# Patient Record
Sex: Female | Born: 1995 | Race: Black or African American | Hispanic: No | Marital: Single | Smoking: Never smoker
Health system: Southern US, Community
[De-identification: ages and names within clinical notes are randomized; demographics above are authoritative.]

---
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
## 2018-02-02 ENCOUNTER — Encounter (HOSPITAL_COMMUNITY): Payer: Self-pay

## 2018-02-02 ENCOUNTER — Other Ambulatory Visit: Payer: Self-pay

## 2018-02-02 ENCOUNTER — Emergency Department (HOSPITAL_COMMUNITY)
Admission: EM | Admit: 2018-02-02 | Discharge: 2018-02-02 | Disposition: A | Discharge status: Home / Self Care | Payer: Medicaid Other | Attending: Emergency Medicine | Admitting: Emergency Medicine

## 2018-02-02 DIAGNOSIS — Z79899 Other long term (current) drug therapy: Secondary | ICD-10-CM | POA: Insufficient documentation

## 2018-02-02 DIAGNOSIS — R21 Rash and other nonspecific skin eruption: Secondary | ICD-10-CM

## 2018-02-02 MED ORDER — TRIAMCINOLONE ACETONIDE 0.1 % EX CREA: 1.0000 "application " | TOPICAL_CREAM | Freq: Two times a day (BID) | CUTANEOUS | 0 refills | Status: AC

## 2018-02-02 NOTE — Discharge Instructions (Signed)
I have prescribed you a steroid cream for the rash. You may apply this 2-3 times per day. Please be sure to clean all bed linens in your room.   Thank you for allowing [place] to take care of you today.

## 2018-02-02 NOTE — ED Triage Notes (Signed)
[place] from [place] [employer] and had seen a bug 3 days ago in apartment.  Apartment sent someone today to check the bugs out and told her to come to ED to see if bites on face and arms were beg bugs.  Picture of bugs on phone look suspicious for beg bugs.  Pt denies seeing any bugs on her clothes skin or in her car.  All bugs she saw in apartment were killed.  A&Ox4

## 2018-02-02 NOTE — ED Provider Notes (Signed)
[facility] EMERGENCY DEPARTMENT Provider Note  CSN: [id number] Arrival date & time: 02/02/18  2042  History   Chief Complaint Chief Complaint  Patient presents with  . Insect Bite   HPI Harleigh Civello is a 22 y.o. female with no significant medical history who presented to the ED for   Animal Bite  Contact animal:  Insect (Suspected bed bugs) Animal bite location: Reports bites on extremities, chest and back. Time since incident:  4 days Pain details:    Quality:  Itching   Severity:  No pain   Progression:  Unchanged Incident location: [employer]. Ineffective treatments:  None tried Associated symptoms: rash   Associated symptoms: no fever, no numbness and no swelling     History reviewed. No pertinent past medical history.  There are no active problems to display for this patient.   History reviewed. No pertinent surgical history.   OB History   None      Home Medications    Prior to Admission medications   Medication Sig Start Date End Date Taking? Authorizing Provider  ibuprofen (ADVIL,MOTRIN) 600 MG tablet Take 1 tablet (600 mg total) by mouth every 8 (eight) hours as needed. 03/31/16   Trixie Dredge, PA-C  triamcinolone cream (KENALOG) 0.1 % Apply 1 application topically 2 (two) times daily. 02/02/18   Mortis, Sharyon Medicus, PA-C    Family History History reviewed. No pertinent family history.  Social History Social History   Tobacco Use  . Smoking status: Never Smoker  . Smokeless tobacco: Never Used  Substance Use Topics  . Alcohol use: No  . Drug use: Not on file     Allergies   Patient has no known allergies.   Review of Systems Review of Systems  Constitutional: Negative for chills and fever.  Eyes: Negative.   Genitourinary: Negative.   Musculoskeletal: Negative.   Skin: Positive for rash.  Neurological: Negative for dizziness, weakness, light-headedness and numbness.   Physical Exam Updated Vital Signs BP  (!) 158/81   Pulse 86   Temp 98.2 F (36.8 C) (Oral)   Resp 16   SpO2 100%   Physical Exam  Constitutional: She appears well-developed and well-nourished. No distress.  Musculoskeletal: Normal range of motion.  Skin: Skin is warm and intact. Capillary refill takes less than 2 seconds. No lesion and no rash noted. No erythema.  No rash visualized.  Nursing note and vitals reviewed.  ED Treatments / Results  Labs (all labs ordered are listed, but only abnormal results are displayed) Labs Reviewed - No data to display  EKG None  Radiology No results found.  Procedures Procedures (including critical care time)  Medications Ordered in ED Medications - No data to display   Initial Impression / Assessment and Plan / ED Course  Triage vital signs and the nursing notes have been reviewed.  Pertinent labs & imaging results that were available during care of the patient were reviewed and considered in medical decision making (see chart for details).  Patient presents to the ED afebrile with complaints of a rash. She reports that her dorm has bed bugs since Monday. She describes generalized, pruritic rash all over her body from bed bug bites. However, on physical exam, there is no rash present or any other findings suggestive of bed bugs.    Final Clinical Impressions(s) / ED Diagnoses  1. Rash. Rx for Kenalog prescribed. Education provided on appropriate house and linen cleaning.  Dispo: Home. After thorough clinical evaluation, this  patient is determined to be medically stable and can be safely discharged with the previously mentioned treatment and/or outpatient follow-up/referral(s). At this time, there are no other apparent medical conditions that require further screening, evaluation or treatment.   Final diagnoses:  Rash    ED Discharge Orders         Ordered    triamcinolone cream (KENALOG) 0.1 %  2 times daily     02/02/18 2158            Reva Bores 02/02/18 2158    Melene Plan, DO 02/03/18 0002

## 2018-07-08 IMAGING — CR DG KNEE COMPLETE 4+V*L*
5 series · 5 of 5 positions shown · non-contrast
Comparison: None.

CLINICAL DATA: Slipped on wet floor, fell.  LEFT knee pain.

EXAM:
LEFT KNEE - COMPLETE 4+ VIEW

[t knee ap left]
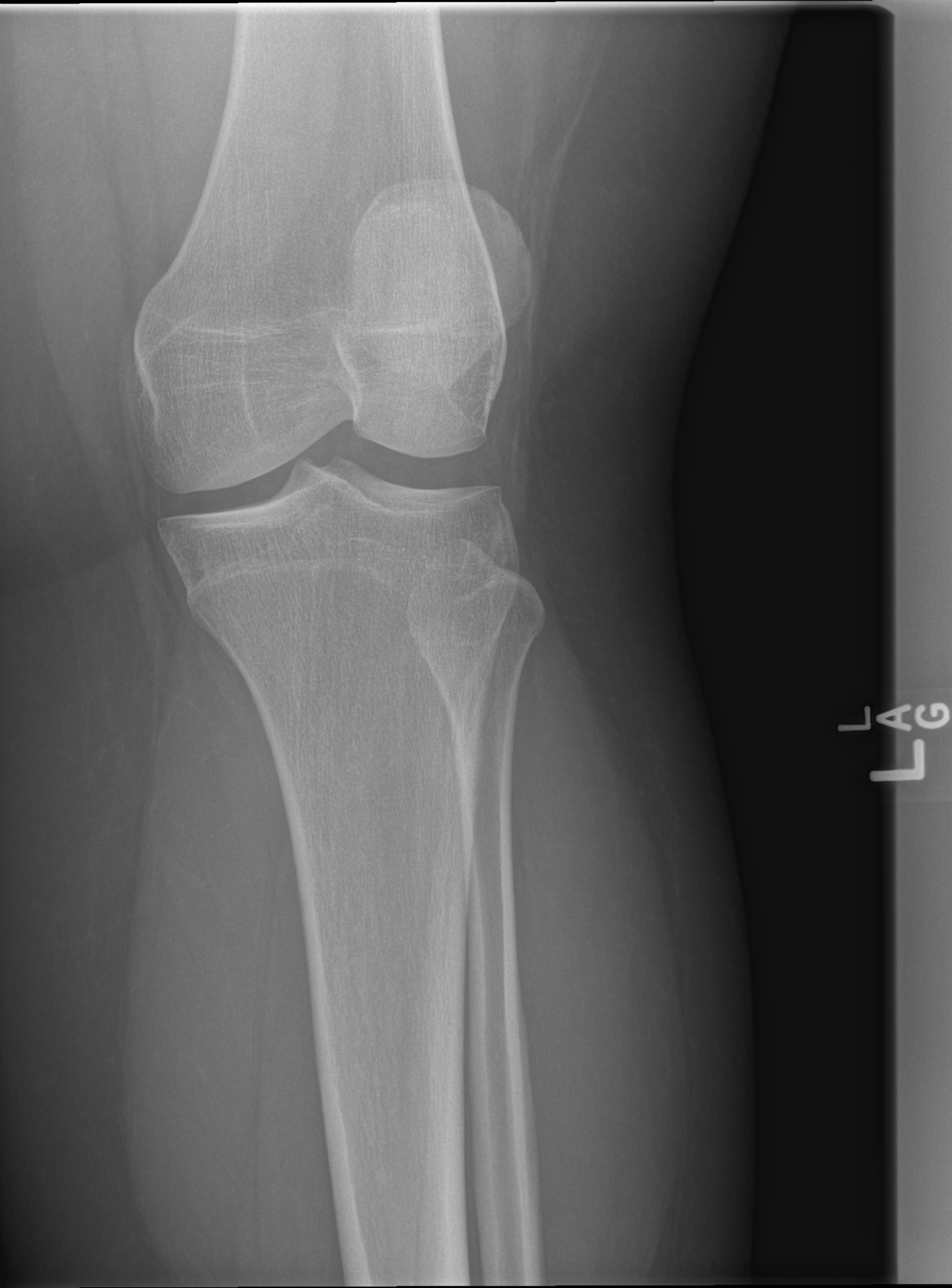

[t knee obl left (1 of 2)]
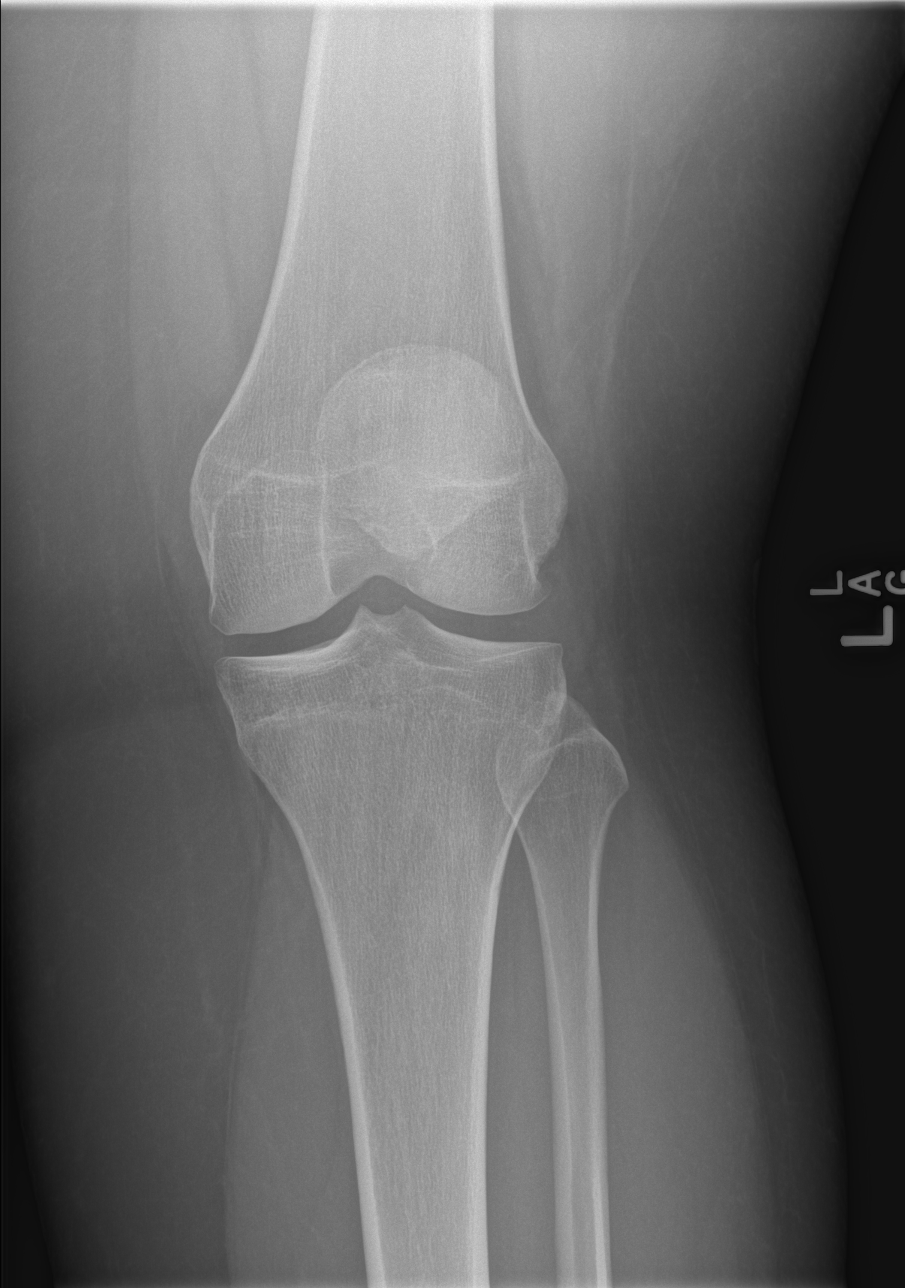

[t knee obl left (2 of 2)]
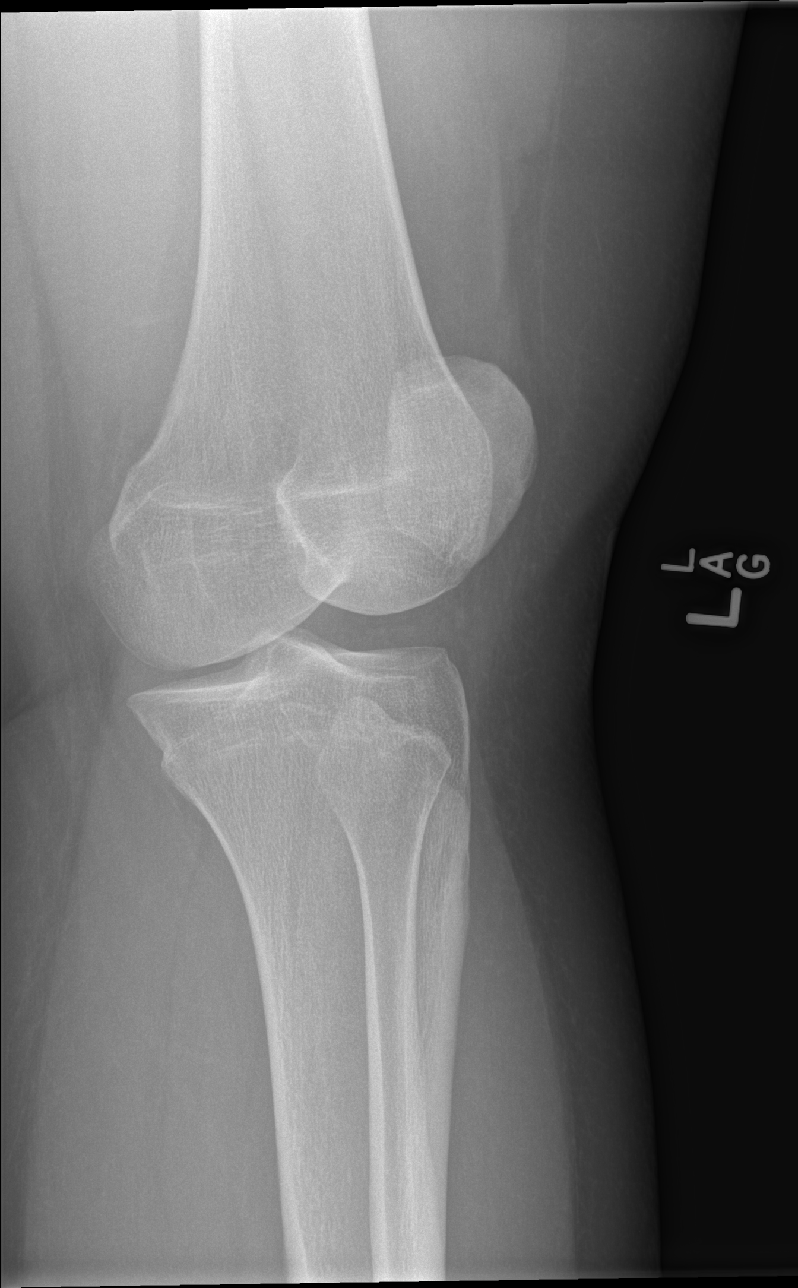

[t knee lat left (1 of 2)]
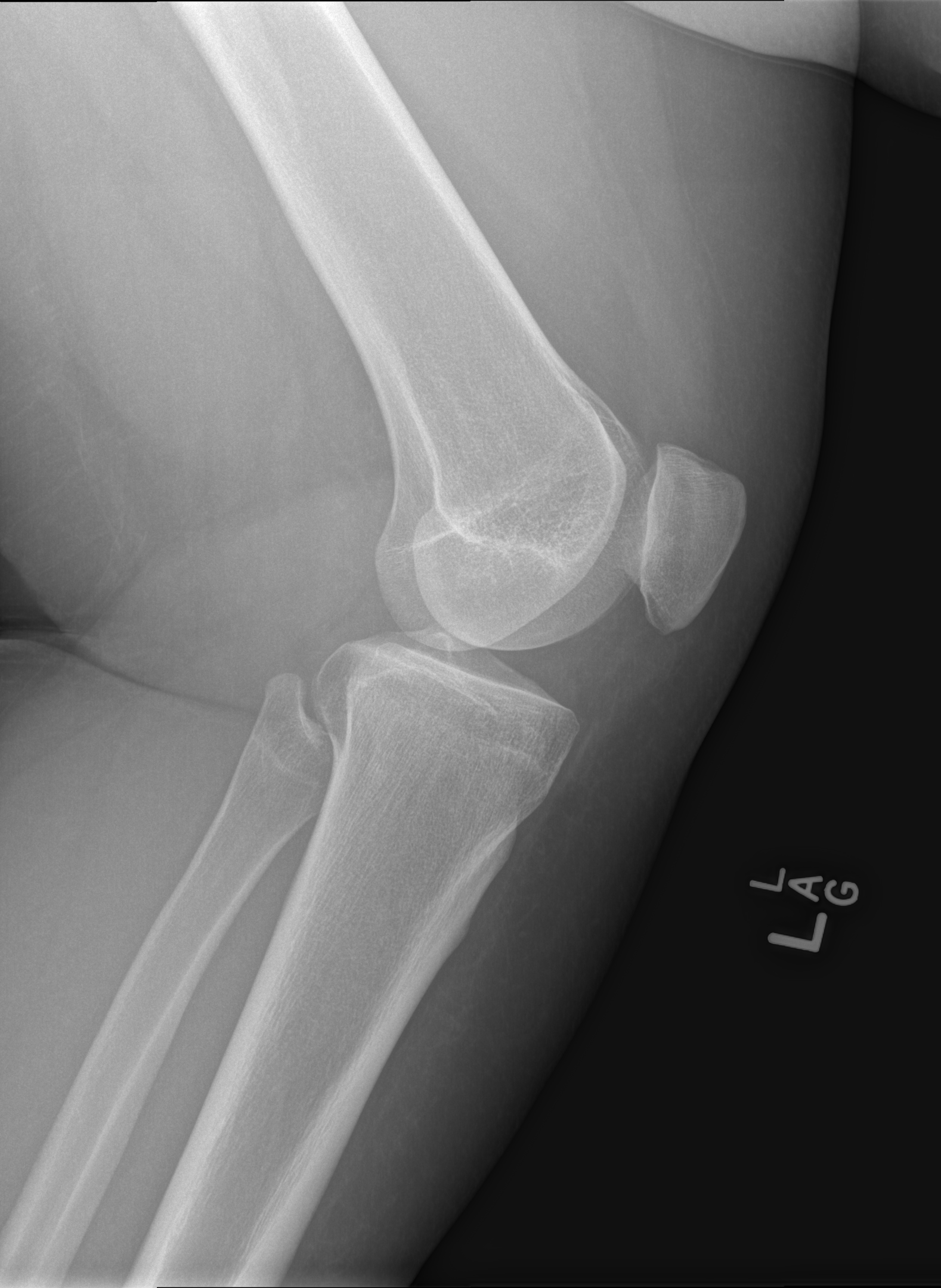

[t knee lat left (2 of 2)]
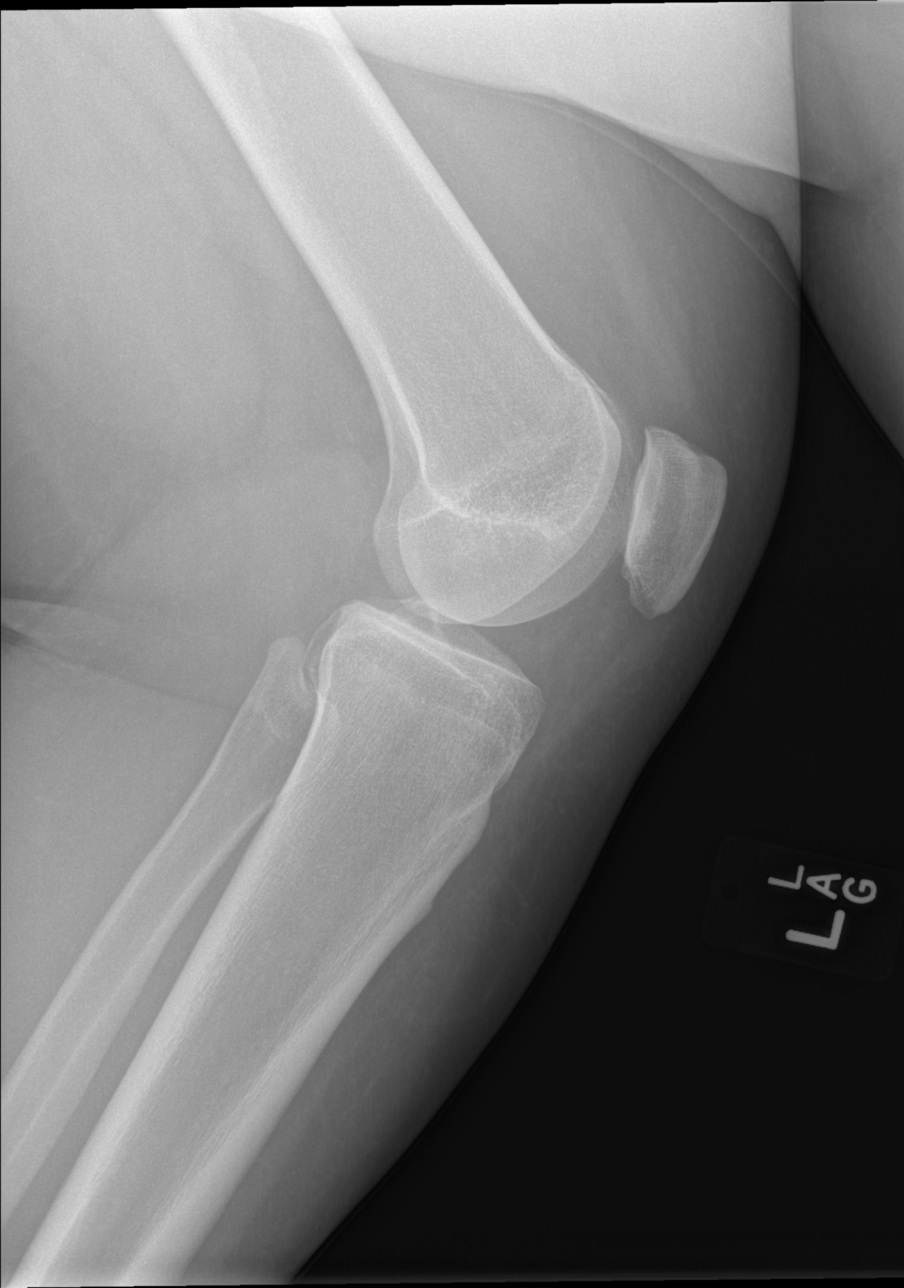

[5 of 5 positions shown; findings below may reference images not displayed]

FINDINGS: No evidence of fracture, dislocation, or joint effusion. No evidence
of arthropathy or other focal bone abnormality. Soft tissues are
unremarkable.
IMPRESSION: Negative.
